# Patient Record
Sex: Female | Born: 1970 | Race: White | Hispanic: No | Smoking: Never smoker
Health system: Southern US, Community
[De-identification: ages and names within clinical notes are randomized; demographics above are authoritative.]

## PROBLEM LIST (undated history)

## (undated) DIAGNOSIS — F32A Depression, unspecified: Secondary | ICD-10-CM

## (undated) DIAGNOSIS — F419 Anxiety disorder, unspecified: Secondary | ICD-10-CM

## (undated) HISTORY — DX: Depression, unspecified: F32.A

## (undated) HISTORY — DX: Anxiety disorder, unspecified: F41.9

---

## 2012-12-03 ENCOUNTER — Other Ambulatory Visit: Payer: Self-pay | Admitting: Gynecology

## 2012-12-03 DIAGNOSIS — N6321 Unspecified lump in the left breast, upper outer quadrant: Secondary | ICD-10-CM

## 2012-12-19 ENCOUNTER — Other Ambulatory Visit: Payer: Self-pay | Admitting: Gynecology

## 2012-12-19 ENCOUNTER — Ambulatory Visit
Admission: RE | Admit: 2012-12-19 | Discharge: 2012-12-19 | Disposition: A | Discharge status: Home / Self Care | Payer: BC Managed Care – PPO | Source: Ambulatory Visit | Attending: Gynecology | Admitting: Gynecology

## 2012-12-19 DIAGNOSIS — N6321 Unspecified lump in the left breast, upper outer quadrant: Secondary | ICD-10-CM

## 2013-05-27 ENCOUNTER — Other Ambulatory Visit: Payer: Self-pay | Admitting: Gynecology

## 2013-05-27 DIAGNOSIS — N6310 Unspecified lump in the right breast, unspecified quadrant: Secondary | ICD-10-CM

## 2013-05-27 DIAGNOSIS — R921 Mammographic calcification found on diagnostic imaging of breast: Secondary | ICD-10-CM

## 2013-06-17 ENCOUNTER — Ambulatory Visit
Admission: RE | Admit: 2013-06-17 | Discharge: 2013-06-17 | Disposition: A | Discharge status: Home / Self Care | Payer: BC Managed Care – PPO | Source: Ambulatory Visit | Attending: Gynecology | Admitting: Gynecology

## 2013-06-17 DIAGNOSIS — R921 Mammographic calcification found on diagnostic imaging of breast: Secondary | ICD-10-CM

## 2013-11-19 ENCOUNTER — Other Ambulatory Visit: Payer: Self-pay | Admitting: Gynecology

## 2013-11-19 DIAGNOSIS — R921 Mammographic calcification found on diagnostic imaging of breast: Secondary | ICD-10-CM

## 2014-02-17 ENCOUNTER — Ambulatory Visit
Admission: RE | Admit: 2014-02-17 | Discharge: 2014-02-17 | Disposition: A | Discharge status: Home / Self Care | Payer: BC Managed Care – PPO | Source: Ambulatory Visit | Attending: Gynecology | Admitting: Gynecology

## 2014-02-17 DIAGNOSIS — R921 Mammographic calcification found on diagnostic imaging of breast: Secondary | ICD-10-CM

## 2014-07-15 ENCOUNTER — Other Ambulatory Visit: Payer: Self-pay | Admitting: Physician Assistant

## 2014-07-15 DIAGNOSIS — R921 Mammographic calcification found on diagnostic imaging of breast: Secondary | ICD-10-CM

## 2014-08-24 ENCOUNTER — Ambulatory Visit
Admission: RE | Admit: 2014-08-24 | Discharge: 2014-08-24 | Disposition: A | Discharge status: Home / Self Care | Payer: BC Managed Care – PPO | Source: Ambulatory Visit | Attending: Physician Assistant | Admitting: Physician Assistant

## 2014-08-24 DIAGNOSIS — R921 Mammographic calcification found on diagnostic imaging of breast: Secondary | ICD-10-CM

## 2015-01-21 ENCOUNTER — Other Ambulatory Visit: Payer: Self-pay | Admitting: Physician Assistant

## 2015-01-21 DIAGNOSIS — R921 Mammographic calcification found on diagnostic imaging of breast: Secondary | ICD-10-CM

## 2015-02-24 ENCOUNTER — Ambulatory Visit
Admission: RE | Admit: 2015-02-24 | Discharge: 2015-02-24 | Disposition: A | Discharge status: Home / Self Care | Payer: BC Managed Care – PPO | Source: Ambulatory Visit | Attending: Physician Assistant | Admitting: Physician Assistant

## 2015-02-24 DIAGNOSIS — R921 Mammographic calcification found on diagnostic imaging of breast: Secondary | ICD-10-CM

## 2016-04-25 ENCOUNTER — Other Ambulatory Visit: Payer: Self-pay | Admitting: Physician Assistant

## 2016-04-25 DIAGNOSIS — Z1231 Encounter for screening mammogram for malignant neoplasm of breast: Secondary | ICD-10-CM

## 2016-05-12 ENCOUNTER — Ambulatory Visit
Admission: RE | Admit: 2016-05-12 | Discharge: 2016-05-12 | Disposition: A | Discharge status: Home / Self Care | Payer: BC Managed Care – PPO | Source: Ambulatory Visit | Attending: Physician Assistant | Admitting: Physician Assistant

## 2016-05-12 DIAGNOSIS — Z1231 Encounter for screening mammogram for malignant neoplasm of breast: Secondary | ICD-10-CM

## 2016-05-15 ENCOUNTER — Other Ambulatory Visit: Payer: Self-pay | Admitting: Physician Assistant

## 2016-05-15 DIAGNOSIS — R928 Other abnormal and inconclusive findings on diagnostic imaging of breast: Secondary | ICD-10-CM

## 2016-05-17 ENCOUNTER — Ambulatory Visit
Admission: RE | Admit: 2016-05-17 | Discharge: 2016-05-17 | Disposition: A | Discharge status: Home / Self Care | Payer: BC Managed Care – PPO | Source: Ambulatory Visit | Attending: Physician Assistant | Admitting: Physician Assistant

## 2016-05-17 DIAGNOSIS — R928 Other abnormal and inconclusive findings on diagnostic imaging of breast: Secondary | ICD-10-CM

## 2017-03-26 ENCOUNTER — Other Ambulatory Visit: Payer: Self-pay | Admitting: Physician Assistant

## 2017-03-26 DIAGNOSIS — Z1231 Encounter for screening mammogram for malignant neoplasm of breast: Secondary | ICD-10-CM

## 2017-05-14 ENCOUNTER — Ambulatory Visit
Admission: RE | Admit: 2017-05-14 | Discharge: 2017-05-14 | Disposition: A | Discharge status: Home / Self Care | Payer: BC Managed Care – PPO | Source: Ambulatory Visit | Attending: Physician Assistant | Admitting: Physician Assistant

## 2017-05-14 DIAGNOSIS — Z1231 Encounter for screening mammogram for malignant neoplasm of breast: Secondary | ICD-10-CM

## 2018-04-29 ENCOUNTER — Other Ambulatory Visit: Payer: Self-pay | Admitting: Physician Assistant

## 2018-04-29 DIAGNOSIS — Z1231 Encounter for screening mammogram for malignant neoplasm of breast: Secondary | ICD-10-CM

## 2018-05-31 ENCOUNTER — Ambulatory Visit: Payer: BC Managed Care – PPO

## 2018-06-28 ENCOUNTER — Ambulatory Visit: Payer: BC Managed Care – PPO

## 2018-08-16 ENCOUNTER — Ambulatory Visit
Admission: RE | Admit: 2018-08-16 | Discharge: 2018-08-16 | Disposition: A | Discharge status: Home / Self Care | Payer: BC Managed Care – PPO | Source: Ambulatory Visit | Attending: Physician Assistant | Admitting: Physician Assistant

## 2018-08-16 DIAGNOSIS — Z1231 Encounter for screening mammogram for malignant neoplasm of breast: Secondary | ICD-10-CM

## 2019-05-02 ENCOUNTER — Other Ambulatory Visit: Payer: Self-pay | Admitting: Physician Assistant

## 2019-05-02 DIAGNOSIS — Z1231 Encounter for screening mammogram for malignant neoplasm of breast: Secondary | ICD-10-CM

## 2019-08-20 ENCOUNTER — Ambulatory Visit
Admission: RE | Admit: 2019-08-20 | Discharge: 2019-08-20 | Disposition: A | Discharge status: Home / Self Care | Payer: BLUE CROSS/BLUE SHIELD | Source: Ambulatory Visit | Attending: Physician Assistant | Admitting: Physician Assistant

## 2019-08-20 ENCOUNTER — Other Ambulatory Visit: Payer: Self-pay

## 2019-08-20 DIAGNOSIS — Z1231 Encounter for screening mammogram for malignant neoplasm of breast: Secondary | ICD-10-CM

## 2020-10-13 ENCOUNTER — Other Ambulatory Visit: Payer: Self-pay | Admitting: Physician Assistant

## 2020-10-13 DIAGNOSIS — Z1231 Encounter for screening mammogram for malignant neoplasm of breast: Secondary | ICD-10-CM

## 2020-11-16 ENCOUNTER — Other Ambulatory Visit: Payer: Self-pay

## 2020-11-16 ENCOUNTER — Ambulatory Visit: Payer: BLUE CROSS/BLUE SHIELD

## 2020-11-16 ENCOUNTER — Ambulatory Visit
Admission: RE | Admit: 2020-11-16 | Discharge: 2020-11-16 | Disposition: A | Discharge status: Home / Self Care | Payer: BC Managed Care – PPO | Source: Ambulatory Visit | Attending: Physician Assistant | Admitting: Physician Assistant

## 2020-11-16 DIAGNOSIS — Z1231 Encounter for screening mammogram for malignant neoplasm of breast: Secondary | ICD-10-CM

## 2020-11-18 ENCOUNTER — Ambulatory Visit: Payer: Self-pay

## 2021-06-16 IMAGING — MG DIGITAL SCREENING BILAT W/ TOMO W/ CAD
8 series · 9 of 24 positions shown · non-contrast
Comparison: Previous exam(s).

CLINICAL DATA: Screening.

EXAM:
DIGITAL SCREENING BILATERAL MAMMOGRAM WITH TOMO AND CAD

[R MLO synth-2D]
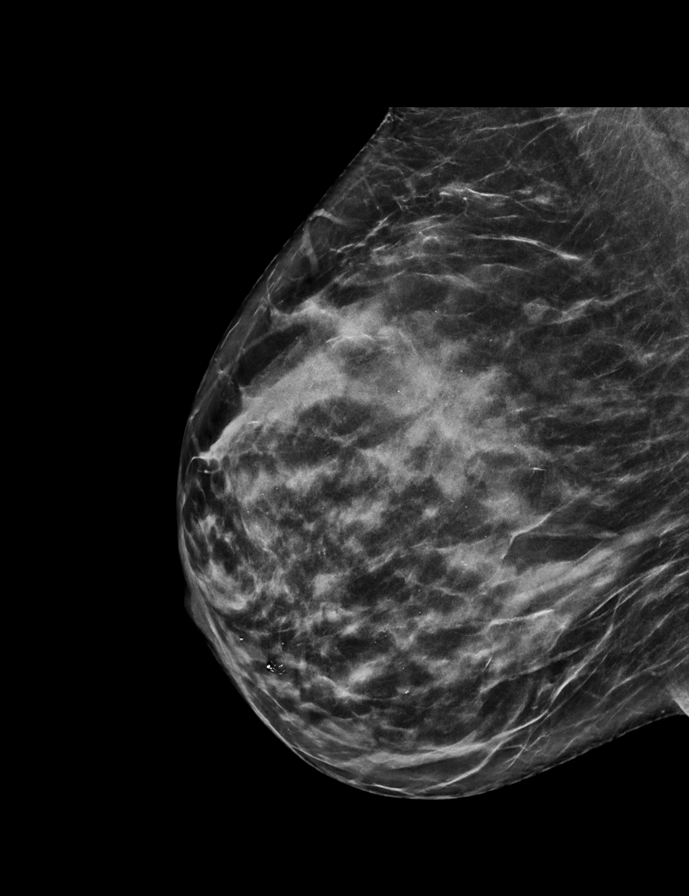

[R CC synth-2D]
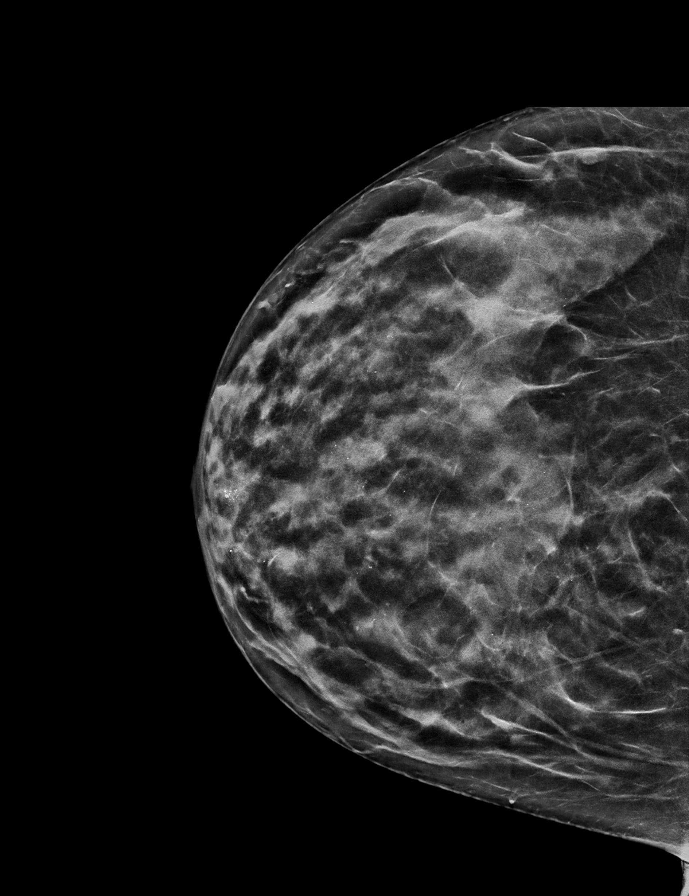

[L CC synth-2D]
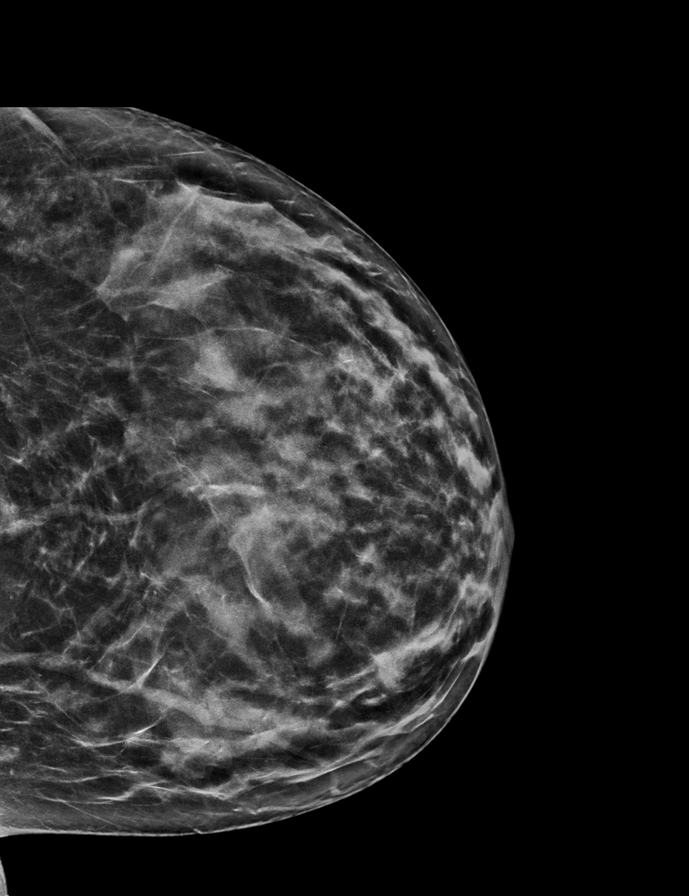

[L MLO synth-2D]
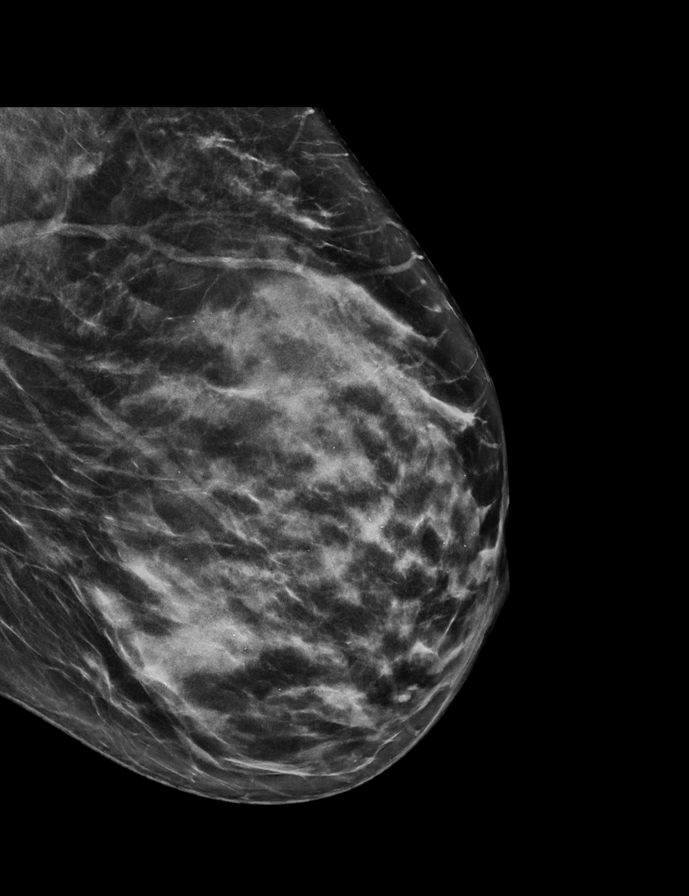

[L CC tomo · 2 of 63 frames shown]
[frame 21/63]
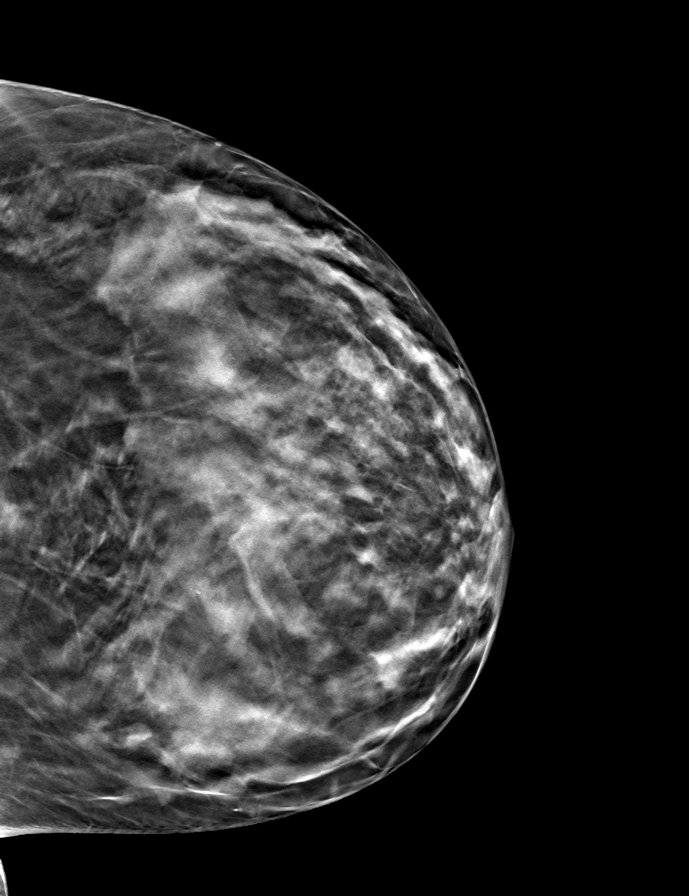
[frame 32/63]
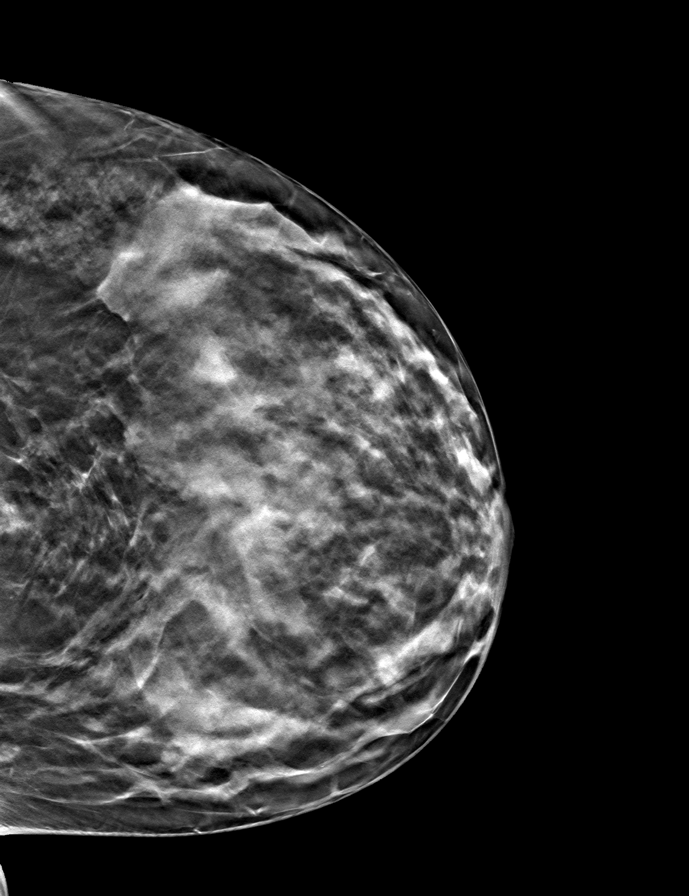

[R MLO tomo · tomo slice 33/64.0]
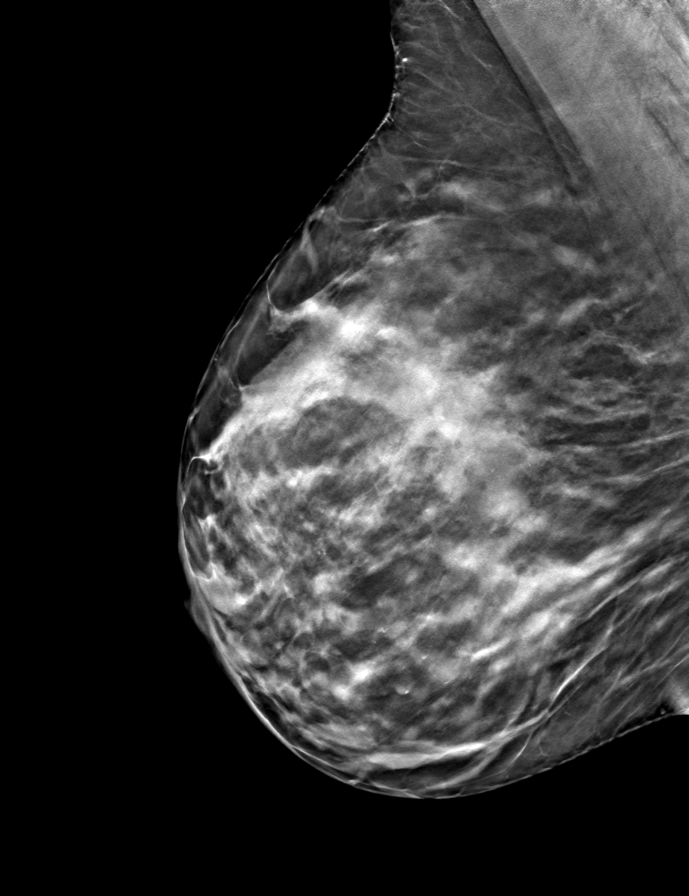

[L MLO tomo · tomo slice 33/66.0]
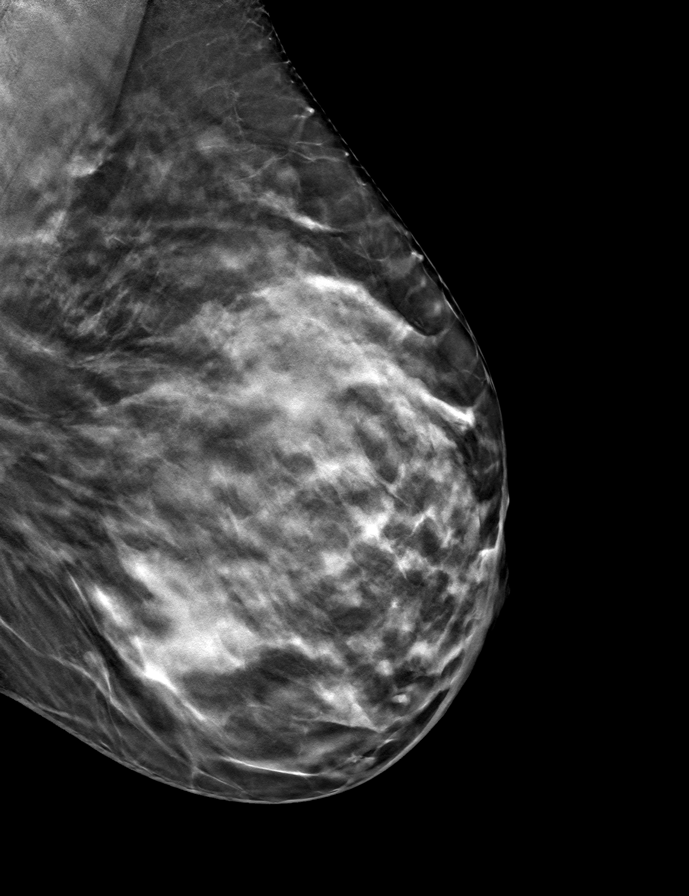

[R CC tomo · tomo slice 29/58.0]
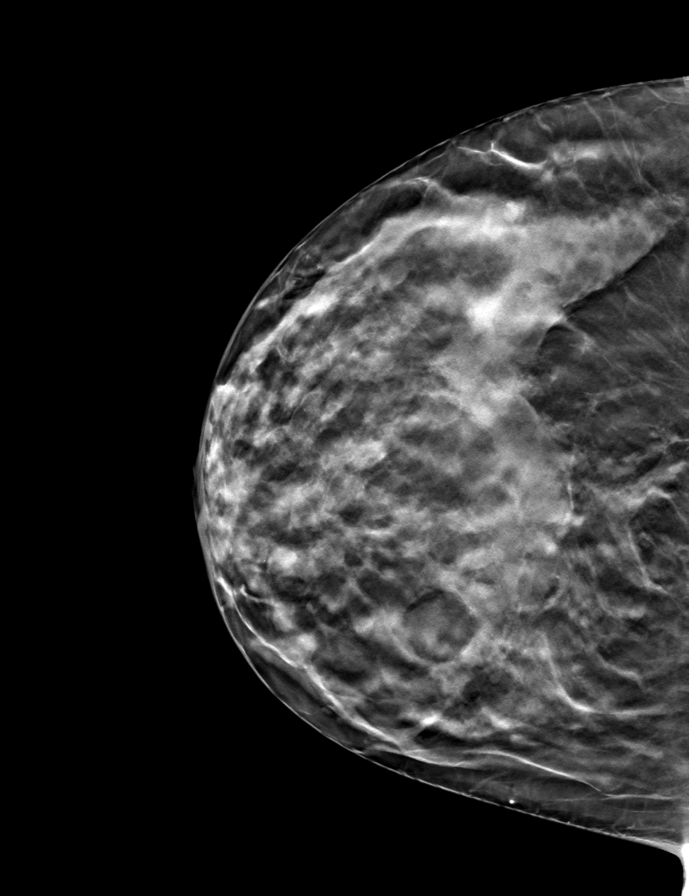

[9 of 24 positions shown; findings below may reference images not displayed]

ACR Breast Density Category c: The breast tissue is heterogeneously
dense, which may obscure small masses.
FINDINGS: There are no findings suspicious for malignancy. Images were
processed with CAD.
IMPRESSION: No mammographic evidence of malignancy. A result letter of this
screening mammogram will be mailed directly to the patient.

RECOMMENDATION:
Screening mammogram in one year. (Code:FT-[id number])

BI-RADS CATEGORY  1: Negative.

## 2021-10-12 ENCOUNTER — Other Ambulatory Visit: Payer: Self-pay | Admitting: Physician Assistant

## 2021-10-12 DIAGNOSIS — Z1231 Encounter for screening mammogram for malignant neoplasm of breast: Secondary | ICD-10-CM

## 2021-11-17 ENCOUNTER — Ambulatory Visit: Payer: BC Managed Care – PPO

## 2021-11-24 ENCOUNTER — Ambulatory Visit: Payer: BC Managed Care – PPO

## 2021-11-28 ENCOUNTER — Ambulatory Visit
Admission: RE | Admit: 2021-11-28 | Discharge: 2021-11-28 | Disposition: A | Discharge status: Home / Self Care | Payer: BC Managed Care – PPO | Source: Ambulatory Visit | Attending: Physician Assistant | Admitting: Physician Assistant

## 2021-11-28 DIAGNOSIS — Z1231 Encounter for screening mammogram for malignant neoplasm of breast: Secondary | ICD-10-CM

## 2021-12-16 DIAGNOSIS — I878 Other specified disorders of veins: Secondary | ICD-10-CM | POA: Diagnosis not present

## 2021-12-20 DIAGNOSIS — F341 Dysthymic disorder: Secondary | ICD-10-CM | POA: Diagnosis not present

## 2021-12-20 DIAGNOSIS — F331 Major depressive disorder, recurrent, moderate: Secondary | ICD-10-CM | POA: Diagnosis not present

## 2021-12-26 DIAGNOSIS — F332 Major depressive disorder, recurrent severe without psychotic features: Secondary | ICD-10-CM | POA: Diagnosis not present

## 2022-01-09 DIAGNOSIS — F332 Major depressive disorder, recurrent severe without psychotic features: Secondary | ICD-10-CM | POA: Diagnosis not present

## 2022-01-11 DIAGNOSIS — F332 Major depressive disorder, recurrent severe without psychotic features: Secondary | ICD-10-CM | POA: Diagnosis not present

## 2022-01-12 DIAGNOSIS — M19011 Primary osteoarthritis, right shoulder: Secondary | ICD-10-CM | POA: Diagnosis not present

## 2022-01-12 DIAGNOSIS — R52 Pain, unspecified: Secondary | ICD-10-CM | POA: Diagnosis not present

## 2022-01-16 DIAGNOSIS — F332 Major depressive disorder, recurrent severe without psychotic features: Secondary | ICD-10-CM | POA: Diagnosis not present

## 2022-01-23 DIAGNOSIS — F332 Major depressive disorder, recurrent severe without psychotic features: Secondary | ICD-10-CM | POA: Diagnosis not present

## 2022-01-24 DIAGNOSIS — F332 Major depressive disorder, recurrent severe without psychotic features: Secondary | ICD-10-CM | POA: Diagnosis not present

## 2022-01-27 DIAGNOSIS — F332 Major depressive disorder, recurrent severe without psychotic features: Secondary | ICD-10-CM | POA: Diagnosis not present

## 2022-02-02 DIAGNOSIS — F332 Major depressive disorder, recurrent severe without psychotic features: Secondary | ICD-10-CM | POA: Diagnosis not present

## 2022-02-07 DIAGNOSIS — F332 Major depressive disorder, recurrent severe without psychotic features: Secondary | ICD-10-CM | POA: Diagnosis not present

## 2022-02-09 DIAGNOSIS — F332 Major depressive disorder, recurrent severe without psychotic features: Secondary | ICD-10-CM | POA: Diagnosis not present

## 2022-02-10 DIAGNOSIS — F332 Major depressive disorder, recurrent severe without psychotic features: Secondary | ICD-10-CM | POA: Diagnosis not present

## 2022-02-16 DIAGNOSIS — L905 Scar conditions and fibrosis of skin: Secondary | ICD-10-CM | POA: Diagnosis not present

## 2022-02-16 DIAGNOSIS — Z85820 Personal history of malignant melanoma of skin: Secondary | ICD-10-CM | POA: Diagnosis not present

## 2022-02-16 DIAGNOSIS — L821 Other seborrheic keratosis: Secondary | ICD-10-CM | POA: Diagnosis not present

## 2022-02-16 DIAGNOSIS — D225 Melanocytic nevi of trunk: Secondary | ICD-10-CM | POA: Diagnosis not present

## 2022-02-22 DIAGNOSIS — F332 Major depressive disorder, recurrent severe without psychotic features: Secondary | ICD-10-CM | POA: Diagnosis not present

## 2022-03-06 DIAGNOSIS — F332 Major depressive disorder, recurrent severe without psychotic features: Secondary | ICD-10-CM | POA: Diagnosis not present

## 2022-03-24 DIAGNOSIS — F332 Major depressive disorder, recurrent severe without psychotic features: Secondary | ICD-10-CM | POA: Diagnosis not present

## 2022-05-31 DIAGNOSIS — H2512 Age-related nuclear cataract, left eye: Secondary | ICD-10-CM | POA: Diagnosis not present

## 2022-05-31 DIAGNOSIS — H25013 Cortical age-related cataract, bilateral: Secondary | ICD-10-CM | POA: Diagnosis not present

## 2022-05-31 DIAGNOSIS — H33303 Unspecified retinal break, bilateral: Secondary | ICD-10-CM | POA: Diagnosis not present

## 2022-05-31 DIAGNOSIS — H2513 Age-related nuclear cataract, bilateral: Secondary | ICD-10-CM | POA: Diagnosis not present

## 2022-05-31 DIAGNOSIS — H31093 Other chorioretinal scars, bilateral: Secondary | ICD-10-CM | POA: Diagnosis not present

## 2022-06-05 ENCOUNTER — Encounter (INDEPENDENT_AMBULATORY_CARE_PROVIDER_SITE_OTHER): Payer: BC Managed Care – PPO | Admitting: Ophthalmology

## 2022-06-05 DIAGNOSIS — H2513 Age-related nuclear cataract, bilateral: Secondary | ICD-10-CM | POA: Diagnosis not present

## 2022-06-05 DIAGNOSIS — H4423 Degenerative myopia, bilateral: Secondary | ICD-10-CM | POA: Diagnosis not present

## 2022-06-05 DIAGNOSIS — H43813 Vitreous degeneration, bilateral: Secondary | ICD-10-CM

## 2022-07-04 DIAGNOSIS — H25041 Posterior subcapsular polar age-related cataract, right eye: Secondary | ICD-10-CM | POA: Diagnosis not present

## 2022-07-04 DIAGNOSIS — H269 Unspecified cataract: Secondary | ICD-10-CM | POA: Diagnosis not present

## 2022-07-04 DIAGNOSIS — H2512 Age-related nuclear cataract, left eye: Secondary | ICD-10-CM | POA: Diagnosis not present

## 2022-07-04 DIAGNOSIS — H2511 Age-related nuclear cataract, right eye: Secondary | ICD-10-CM | POA: Diagnosis not present

## 2022-07-04 DIAGNOSIS — H25011 Cortical age-related cataract, right eye: Secondary | ICD-10-CM | POA: Diagnosis not present

## 2022-07-08 HISTORY — PX: CATARACT EXTRACTION, BILATERAL: SHX1313

## 2022-07-11 DIAGNOSIS — H2511 Age-related nuclear cataract, right eye: Secondary | ICD-10-CM | POA: Diagnosis not present

## 2022-07-11 DIAGNOSIS — H269 Unspecified cataract: Secondary | ICD-10-CM | POA: Diagnosis not present

## 2022-08-30 DIAGNOSIS — Z0001 Encounter for general adult medical examination with abnormal findings: Secondary | ICD-10-CM | POA: Diagnosis not present

## 2022-08-30 DIAGNOSIS — R55 Syncope and collapse: Secondary | ICD-10-CM | POA: Diagnosis not present

## 2022-09-26 ENCOUNTER — Encounter: Payer: Self-pay | Admitting: Cardiology

## 2022-09-26 ENCOUNTER — Ambulatory Visit: Payer: BC Managed Care – PPO | Admitting: Cardiology

## 2022-09-26 VITALS — BP 116/75 | HR 72 | Resp 16 | Ht 63.0 in | Wt 139.0 lb

## 2022-09-26 DIAGNOSIS — E7800 Pure hypercholesterolemia, unspecified: Secondary | ICD-10-CM

## 2022-09-26 DIAGNOSIS — R55 Syncope and collapse: Secondary | ICD-10-CM

## 2022-09-26 NOTE — Progress Notes (Signed)
ID:  Cassie Andrade, DOB 1970-11-16, MRN [id number]  PCP:  Cassie Irani, PA-C  Cardiologist:  Cassie Lerner, DO, [facility] (established care 09/26/22)  REASON FOR CONSULT: Syncope  REQUESTING PHYSICIAN:  Cassie Irani, PA-C [address] [place],  [place] [address]  Chief Complaint  Patient presents with   Loss of Consciousness   Abnormal ECG   New Patient (Initial Visit)    HPI  Cassie Andrade is a 52 y.o. Caucasian female who presents to the clinic for evaluation of syncope at the request of Cassie Andrade. Her past medical history and cardiovascular risk factors include: hyperlipidemia.   Patient is referred to practice for evaluation of recent syncope.  She recently had cataract surgery end of June/July 2024 and postsurgery was placed on 3 drops.  After completion of her eyedrop on the weekend she was at a local bar thinks that she had a panic attack and had passed out.  Upon further questioning patient states that prior to passing out she had tunnel vision, felt hot/diaphoretic, wanted to drink water / ice, heart palpitations, and difficulty in breathing.  And eventually passed out.  She was accompanied by her significant other and neighbor.  At the bar there were physicians and RN who came to help the patient.  Patient states that she did not have a pulse for 2 minutes (accuracy of this information is unknown).  EMS came and she was advised to go to the hospital for further evaluation and management.  Patient refused.  Patient states that she has " limited trust in medical people since COVID-19 pandemic.  And the fact pharmaceutical companies own everything."  After syncopal event she followed up with PCP and was referred to cardiology given his episode and EKG at their office noting T wave inversions.  Since her index event she has had a couple episodes of near syncope in the following 2 weeks.    However has been asymptomatic for the last 2 or 3 weeks.  She has  had episodes of passing out while growing up the last event was in her late teen years.  Her triggers in the past used to be blood draws/side of blood.  No exertional syncope.  No excessive consumption of caffeinated beverages.  No prior history of thyroid disease or anemia.  No consumption of soda, illicits, energy drinks, weight loss supplements, stimulants.  FUNCTIONAL STATUS: Walks and Yoga regularly.    CARDIAC DATABASE: EKG: 09/26/2022: Sinus rhythm, 65 bpm, nonspecific T wave abnormality.  Echocardiogram: No results found for this or any previous visit from the past 1095 days.   ALLERGIES: No Known Allergies  MEDICATION LIST PRIOR TO VISIT: Current Meds  Medication Sig   Ascorbic Acid (VITAMIN C PO) Take by mouth.   Magnesium Chloride (MAGNESIUM DR PO) Take by mouth.   VITAMIN D PO Take by mouth.     PAST MEDICAL HISTORY: Past Medical History:  Diagnosis Date   Anxiety    Depression     PAST SURGICAL HISTORY: Past Surgical History:  Procedure Laterality Date   CATARACT EXTRACTION, BILATERAL Bilateral 07/2022    FAMILY HISTORY: The patient family history includes Anxiety disorder in her father; Depression in her father; Heart disease in her maternal grandmother; Obesity in her maternal grandmother.  SOCIAL HISTORY:  The patient  reports that she has never smoked. She has never used smokeless tobacco. She reports current alcohol use. She reports that she does not use drugs.  REVIEW  OF SYSTEMS: Review of Systems  Cardiovascular:  Positive for near-syncope (last event greater than two weeks prior) and syncope (see HPI). Negative for chest pain, claudication, dyspnea on exertion, irregular heartbeat, leg swelling, orthopnea, palpitations and paroxysmal nocturnal dyspnea.  Respiratory:  Negative for shortness of breath.   Hematologic/Lymphatic: Negative for bleeding problem.  Musculoskeletal:  Negative for muscle cramps and myalgias.  Neurological:  Negative for  dizziness and light-headedness.    PHYSICAL EXAM:    09/26/2022   10:36 AM  Vitals with BMI  Height 5\' 3"   Weight 139 lbs  BMI 24.63  Systolic 116  Diastolic 75  Pulse 72   Orthostatic VS for the past 72 hrs (Last 3 readings):  Orthostatic BP Patient Position BP Location Cuff Size Orthostatic Pulse  09/26/22 1149 110/77 Standing Left Arm Normal 72  09/26/22 1148 115/77 Sitting Left Arm Normal 73  09/26/22 1147 112/74 Supine Left Arm Normal 61   Physical Exam  Constitutional: No distress.  Age appropriate, hemodynamically stable.   Neck: No JVD present.  Cardiovascular: Normal rate, regular rhythm, S1 normal, S2 normal, intact distal pulses and normal pulses. Exam reveals no gallop, no S3 and no S4.  No murmur heard. Pulmonary/Chest: Effort normal and breath sounds normal. No stridor. She has no wheezes. She has no rales.  Abdominal: Soft. Bowel sounds are normal. She exhibits no distension. There is no abdominal tenderness.  Musculoskeletal:        General: No edema.     Cervical back: Neck supple.  Neurological: She is alert and oriented to person, place, and time. She has intact cranial nerves (2-12).  Skin: Skin is warm and moist.    LABORATORY DATA: External Labs: Collected: August 30, 2021 [facility], Care Everywhere. BUN 14, creatinine 0.73. Sodium 142, potassium 4.5, chloride 103, bicarb 25. AST 17, ALT of 12, alkaline phosphatase 46 Total cholesterol 286, triglycerides 84, HDL 85, LDL calculated 187, non-HDL 201 TSH 2.48 Hemoglobin 14.2 g/dL  IMPRESSION:    [id number]   1. Syncope and collapse  R55 EKG 12-Lead    PCV ECHOCARDIOGRAM COMPLETE    PCV CARDIAC STRESS TEST    LONG TERM MONITOR (3-14 DAYS)    CT CARDIAC SCORING (SELF PAY ONLY)    2. Pure hypercholesterolemia  E78.00 CT CARDIAC SCORING (SELF PAY ONLY)       RECOMMENDATIONS: Cassie Andrade is a 52 y.o. Caucasian female whose past medical history and cardiac risk factors include:  Hyperlipidemia.  Syncope and collapse Based on symptoms her most recent event likely secondary to vasovagal syncope. No episodes of near syncope over the last 2 to 3 weeks EKG shows sinus rhythm with nonspecific T wave abnormality. Orthostatic vital signs negative. Echo will be ordered to evaluate for structural heart disease and left ventricular systolic function. Zio patch for 2 weeks to evaluate for dysrhythmias. Exercise nuclear stress test to evaluate for functional capacity, exercise-induced arrhythmia/ischemia. Her most recent lipid profile from July 2023 notes an LDL level of 187 mg/dL.  Patient chooses not to be on medical therapy.  Concerned that she may have familial hypercholesterolemia.  Recommend a coronary calcium score for further risk stratification. Patient is advised that if she has syncopal event in the future she should go to the ER for further evaluation and management. She likely had a vasovagal syncope back in June/July 2024.  However, she is aware of [place] driving laws to stop driving after an episode of loss of consciousness until 6 months event-free for  the safety of herself and others.  Pure hypercholesterolemia Last LDL  level in July 2023 available in Care Everywhere 187 mg/dL.  Chooses not to be on statin therapy. Recommended a coronary calcium score for further risk stratification.  Data Reviewed: I have independently reviewed external notes provided by the referring provider as part of this office visit.   I have independently reviewed results of EKG, labs as part of medical decision making. I have ordered the following tests:  Orders Placed This Encounter  Procedures   CT CARDIAC SCORING (SELF PAY ONLY)    Standing Status:   Future    Standing Expiration Date:   09/26/2023    Order Specific Question:   Preferred imaging location?    Answer:   External    Order Specific Question:   Is patient pregnant?    Answer:   No   PCV CARDIAC STRESS TEST     Standing Status:   Future    Standing Expiration Date:   09/26/2023   LONG TERM MONITOR (3-14 DAYS)    Standing Status:   Future    Number of Occurrences:   1    Order Specific Question:   Where should this test be performed?    Answer:   [facility]-CARDIOV[facility]    Order Specific Question:   Does the patient have an implanted cardiac device?    Answer:   No    Order Specific Question:   Prescribed days of wear    Answer:   91    Order Specific Question:   Type of enrollment    Answer:   Clinic Enrollment   EKG 12-Lead   PCV ECHOCARDIOGRAM COMPLETE    Standing Status:   Future    Standing Expiration Date:   09/26/2023   I have now made medications changes at today's encounter as noted above.  FINAL MEDICATION LIST END OF ENCOUNTER: No orders of the defined types were placed in this encounter.   There are no discontinued medications.   Current Outpatient Medications:    Ascorbic Acid (VITAMIN C PO), Take by mouth., Disp: , Rfl:    Magnesium Chloride (MAGNESIUM DR PO), Take by mouth., Disp: , Rfl:    VITAMIN D PO, Take by mouth., Disp: , Rfl:   Orders Placed This Encounter  Procedures   CT CARDIAC SCORING (SELF PAY ONLY)   PCV CARDIAC STRESS TEST   LONG TERM MONITOR (3-14 DAYS)   EKG 12-Lead   PCV ECHOCARDIOGRAM COMPLETE    There are no Patient Instructions on file for this visit.   --Continue cardiac medications as reconciled in final medication list. --Return in about 8 weeks (around 11/21/2022) for Follow up s/p syncope , Review test results. or sooner if needed. --Continue follow-up with your primary care physician regarding the management of your other chronic comorbid conditions.  Patient's questions and concerns were addressed to her satisfaction. She voices understanding of the instructions provided during this encounter.   This note was created using a voice recognition software as a result there may be grammatical errors inadvertently enclosed that do not reflect  the nature of this encounter. Every attempt is made to correct such errors.  Cassie Andrade, [place], [facility]  Pager:  [phone] Office: [phone]

## 2022-09-29 ENCOUNTER — Ambulatory Visit: Payer: BC Managed Care – PPO

## 2022-09-29 ENCOUNTER — Ambulatory Visit (HOSPITAL_COMMUNITY)
Admission: RE | Admit: 2022-09-29 | Discharge: 2022-09-29 | Disposition: A | Discharge status: Home / Self Care | Payer: BC Managed Care – PPO | Source: Ambulatory Visit | Attending: Cardiology | Admitting: Cardiology

## 2022-09-29 DIAGNOSIS — E7800 Pure hypercholesterolemia, unspecified: Secondary | ICD-10-CM | POA: Insufficient documentation

## 2022-09-29 DIAGNOSIS — R55 Syncope and collapse: Secondary | ICD-10-CM | POA: Diagnosis not present

## 2022-09-29 DIAGNOSIS — Z136 Encounter for screening for cardiovascular disorders: Secondary | ICD-10-CM | POA: Diagnosis not present

## 2022-10-03 NOTE — Progress Notes (Signed)
Pt understands and was informed her next OV

## 2022-10-03 NOTE — Progress Notes (Signed)
Tried calling patient no answer left a vm

## 2022-10-11 DIAGNOSIS — H26493 Other secondary cataract, bilateral: Secondary | ICD-10-CM | POA: Diagnosis not present

## 2022-10-12 ENCOUNTER — Ambulatory Visit: Payer: BC Managed Care – PPO

## 2022-10-12 DIAGNOSIS — R55 Syncope and collapse: Secondary | ICD-10-CM

## 2022-10-26 NOTE — Progress Notes (Signed)
Called the patient to review the results. Stress is inconclusive-cannot rule in or rule out heart disease.  Patient informs me that prior to the stress test -she had a prolonged waiting period in the lobby so pre testing conditions were not ideal. And she was moving her arms/hands during the GXT to get her HR up as the treadmill would only go fast every 3 minutes. Still likely lead to the ECG artifact during stress.   After the GXT she had poor experience with getting the Zio activated (so its not done).  I apologized several times during the call for all her inconveniences and offered the office manager to call her given her experience that day.   Since the stress test is inconclusive - recommended CCTA if she has continued symptoms but shared decision was to hold off for now.   I offered to see her back in follow up or she is more than welcome to see another provider in the practice or another practice. She was exteremly dissatisfied w/ her experience on the day of testing and I have done my best to explain and apologize behalf of the practice.   Arantxa Piercey [place], DO, [facility]

## 2022-11-16 ENCOUNTER — Ambulatory Visit: Payer: Self-pay | Admitting: Cardiology

## 2023-01-01 DIAGNOSIS — H26491 Other secondary cataract, right eye: Secondary | ICD-10-CM | POA: Diagnosis not present

## 2023-01-01 DIAGNOSIS — H31093 Other chorioretinal scars, bilateral: Secondary | ICD-10-CM | POA: Diagnosis not present

## 2023-01-01 DIAGNOSIS — H18413 Arcus senilis, bilateral: Secondary | ICD-10-CM | POA: Diagnosis not present

## 2023-01-01 DIAGNOSIS — Z961 Presence of intraocular lens: Secondary | ICD-10-CM | POA: Diagnosis not present

## 2023-01-08 DIAGNOSIS — Z961 Presence of intraocular lens: Secondary | ICD-10-CM | POA: Diagnosis not present

## 2023-01-24 DIAGNOSIS — H26492 Other secondary cataract, left eye: Secondary | ICD-10-CM | POA: Diagnosis not present

## 2023-02-02 DIAGNOSIS — Z961 Presence of intraocular lens: Secondary | ICD-10-CM | POA: Diagnosis not present

## 2023-02-19 DIAGNOSIS — L905 Scar conditions and fibrosis of skin: Secondary | ICD-10-CM | POA: Diagnosis not present

## 2023-02-19 DIAGNOSIS — D485 Neoplasm of uncertain behavior of skin: Secondary | ICD-10-CM | POA: Diagnosis not present

## 2023-02-19 DIAGNOSIS — Z85820 Personal history of malignant melanoma of skin: Secondary | ICD-10-CM | POA: Diagnosis not present

## 2023-02-19 DIAGNOSIS — L821 Other seborrheic keratosis: Secondary | ICD-10-CM | POA: Diagnosis not present

## 2023-08-28 DIAGNOSIS — D229 Melanocytic nevi, unspecified: Secondary | ICD-10-CM | POA: Diagnosis not present

## 2023-08-28 DIAGNOSIS — L821 Other seborrheic keratosis: Secondary | ICD-10-CM | POA: Diagnosis not present

## 2023-08-28 DIAGNOSIS — Z0001 Encounter for general adult medical examination with abnormal findings: Secondary | ICD-10-CM | POA: Diagnosis not present

## 2023-09-04 ENCOUNTER — Other Ambulatory Visit (HOSPITAL_BASED_OUTPATIENT_CLINIC_OR_DEPARTMENT_OTHER): Payer: Self-pay | Admitting: Registered Nurse

## 2023-09-04 DIAGNOSIS — E7800 Pure hypercholesterolemia, unspecified: Secondary | ICD-10-CM

## 2023-09-04 DIAGNOSIS — R5383 Other fatigue: Secondary | ICD-10-CM | POA: Diagnosis not present

## 2023-09-04 DIAGNOSIS — Z0000 Encounter for general adult medical examination without abnormal findings: Secondary | ICD-10-CM | POA: Diagnosis not present

## 2023-09-27 ENCOUNTER — Encounter (HOSPITAL_COMMUNITY): Payer: Self-pay

## 2023-09-27 ENCOUNTER — Other Ambulatory Visit (HOSPITAL_COMMUNITY)
